# Patient Record
Sex: Female | Born: 1995 | Race: Black or African American | Hispanic: No | Marital: Single | Smoking: Current some day smoker
Health system: Southern US, Community
[De-identification: ages and names within clinical notes are randomized; demographics above are authoritative.]

## PROBLEM LIST (undated history)

## (undated) HISTORY — PX: WISDOM TOOTH EXTRACTION: SHX21

---
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
## 2017-12-22 LAB — HM HIV SCREENING LAB: HM HIV Screening: NEGATIVE

## 2020-01-27 ENCOUNTER — Encounter: Payer: Self-pay | Admitting: Physician Assistant

## 2020-01-27 ENCOUNTER — Ambulatory Visit: Payer: Self-pay | Admitting: Physician Assistant

## 2020-01-27 ENCOUNTER — Other Ambulatory Visit: Payer: Self-pay

## 2020-01-27 DIAGNOSIS — Z113 Encounter for screening for infections with a predominantly sexual mode of transmission: Secondary | ICD-10-CM

## 2020-01-27 LAB — WET PREP FOR TRICH, YEAST, CLUE
Trichomonas Exam: NEGATIVE
Yeast Exam: NEGATIVE

## 2020-01-27 NOTE — Progress Notes (Signed)
  [facility] Department STI clinic/screening visit  Subjective:  Patricia Medina is a 24 y.o. female being seen today for an STI screening visit. The patient reports they do not have symptoms.  Patient reports that they do not desire a pregnancy in the next year.   They reported they are not interested in discussing contraception today.  Patient's last menstrual period was 01/16/2020.   Patient has the following medical conditions:  There are no problems to display for this patient.   Chief Complaint  Patient presents with  . SEXUALLY TRANSMITTED DISEASE    screening    HPI  Patient reports that she is not having symptoms but would like a screening today.  Reports history of anemia, dental surgeries, last pap about 1.5 years ago, last HIV test 2020, and that she is using Nexplanon for [facility].   See flowsheet for further details and programmatic requirements.    The following portions of the patient's history were reviewed and updated as appropriate: allergies, current medications, past medical history, past social history, past surgical history and problem list.  Objective:  There were no vitals filed for this visit.  Physical Exam Constitutional:      General: She is not in acute distress.    Appearance: Normal appearance.  HENT:     Head: Normocephalic and atraumatic.     Comments: No nits, lice, or hair loss. No cervical, supraclavicular or axillary adenopathy.    Mouth/Throat:     Mouth: Mucous membranes are moist.     Pharynx: Oropharynx is clear. No oropharyngeal exudate or posterior oropharyngeal erythema.  Eyes:     Conjunctiva/sclera: Conjunctivae normal.  Pulmonary:     Effort: Pulmonary effort is normal.  Abdominal:     Palpations: Abdomen is soft. There is no mass.     Tenderness: There is no abdominal tenderness. There is no guarding or rebound.  Genitourinary:    General: Normal vulva.     Rectum: Normal.     Comments: External genitalia/pubic  area without nits, lice, edema, erythema, lesions and inguinal adenopathy. Vagina with normal mucosa and discharge. Cervix without visible lesions. Uterus firm, mobile, nt, no masses, no CMT, no adnexal tenderness or fullness. Musculoskeletal:     Cervical back: Neck supple. No tenderness.  Skin:    General: Skin is warm and dry.     Findings: No bruising, erythema, lesion or rash.  Neurological:     Mental Status: She is alert and oriented to person, place, and time.  Psychiatric:        Mood and Affect: Mood normal.        Behavior: Behavior normal.        Thought Content: Thought content normal.        Judgment: Judgment normal.      Assessment and Plan:  Patricia Medina is a 24 y.o. female presenting to the [facility] Department for STI screening  1. Screening for STD (sexually transmitted disease) Patient into clinic without symptoms. Reviewed wet mount results and no treatment indicated today. Rec condoms with all sex. Await test results.  Counseled that RN will call if needs to RTC for treatment once results are back. - WET PREP FOR TRICH, YEAST, CLUE - Gonococcus culture - Chlamydia/Gonorrhea [place] [facility] - HIV/HCV [place] [facility] - Syphilis Serology,  Lab     No follow-ups on file.  No future appointments.  Matt Holmes, PA

## 2020-01-27 NOTE — Progress Notes (Signed)
[employer] results reviewed by provider C. [place], [place]. Per above no treatment indicated. Tawny Hopping, RN

## 2020-02-02 LAB — GONOCOCCUS CULTURE

## 2020-02-06 LAB — HM HIV SCREENING LAB: HM HIV Screening: NEGATIVE

## 2020-02-06 LAB — HM HEPATITIS C SCREENING LAB: HM Hepatitis Screen: NEGATIVE

## 2020-02-07 ENCOUNTER — Encounter: Payer: Self-pay | Admitting: Family Medicine

## 2021-02-19 ENCOUNTER — Other Ambulatory Visit: Payer: Self-pay

## 2021-02-19 ENCOUNTER — Encounter: Payer: Self-pay | Admitting: Family Medicine

## 2021-02-19 ENCOUNTER — Ambulatory Visit: Payer: Self-pay

## 2021-02-19 ENCOUNTER — Ambulatory Visit (LOCAL_COMMUNITY_HEALTH_CENTER): Payer: Medicaid Other | Admitting: Family Medicine

## 2021-02-19 VITALS — BP 99/62 | HR 69 | Temp 97.7°F | Resp 18 | Ht 66.5 in | Wt 228.0 lb

## 2021-02-19 DIAGNOSIS — Z30017 Encounter for initial prescription of implantable subdermal contraceptive: Secondary | ICD-10-CM

## 2021-02-19 DIAGNOSIS — Z113 Encounter for screening for infections with a predominantly sexual mode of transmission: Secondary | ICD-10-CM

## 2021-02-19 DIAGNOSIS — Z3046 Encounter for surveillance of implantable subdermal contraceptive: Secondary | ICD-10-CM

## 2021-02-19 DIAGNOSIS — Z01419 Encounter for gynecological examination (general) (routine) without abnormal findings: Secondary | ICD-10-CM

## 2021-02-19 DIAGNOSIS — B9689 Other specified bacterial agents as the cause of diseases classified elsewhere: Secondary | ICD-10-CM

## 2021-02-19 DIAGNOSIS — Z1272 Encounter for screening for malignant neoplasm of vagina: Secondary | ICD-10-CM

## 2021-02-19 DIAGNOSIS — Z3009 Encounter for other general counseling and advice on contraception: Secondary | ICD-10-CM

## 2021-02-19 LAB — WET PREP FOR TRICH, YEAST, CLUE
Trichomonas Exam: NEGATIVE
Yeast Exam: NEGATIVE

## 2021-02-19 MED ORDER — METRONIDAZOLE 500 MG PO TABS: 500.0000 mg | ORAL_TABLET | Freq: Two times a day (BID) | ORAL | 0 refills | Status: AC

## 2021-02-19 MED ORDER — ETONOGESTREL 68 MG ~~LOC~~ IMPL
68.0000 mg | DRUG_IMPLANT | Freq: Once | SUBCUTANEOUS | Status: AC
  Administered 2021-02-19: 68 mg via SUBCUTANEOUS

## 2021-02-19 NOTE — Progress Notes (Signed)
Wet Prep reviewed with provider during clinic visit.   Treatment for BV given per SO.   Patient instructions given for Nexplanon insertion. Nexplanon card given.   Patient aware she will receive a call for any abnormal labs, patient has mychart set up to view other labs.    Floy Sabina, RN

## 2021-02-19 NOTE — Progress Notes (Signed)
Family Planning Visit- Repeat Yearly Visit  Subjective:  Patricia Medina is a 25 y.o. G1P0010  being seen today for an annual wellness visit and to discuss contraception options.   The patient is currently using Nexplanon for pregnancy prevention. Patient does not want a pregnancy in the next year. Patient has the following medical problems: does not have a problem list on file.  Chief Complaint  Patient presents with   Annual Exam   Contraception    Nexplanon     Patient reports here for physical, STI screening, pap and nexplanon removal reinsertion   Patient denies any problems or concerns.     See flowsheet for other program required questions.   Body mass index is 36.25 kg/m. - Patient is eligible for diabetes screening based on BMI and age >37?  not applicable HA1C ordered? not applicable  Patient reports 1 of partners in last year. Desires STI screening?  Yes   Has patient been screened once for HCV in the past?  No  No results found for: HCVAB  Does the patient have current of drug use, have a partner with drug use, and/or has been incarcerated since last result? No  If yes-- Screen for HCV through [facility]   Does the patient meet criteria for HBV testing? No  Criteria:  -Household, sexual or needle sharing contact with HBV -History of drug use -HIV positive -Those with known Hep C   Health Maintenance Due  Topic Date Due   COVID-19 Vaccine (1) Never done   Pneumococcal Vaccine 57-44 Years old (1 - PCV) Never done   HPV VACCINES (1 - 2-dose series) Never done   PAP-Cervical Cytology Screening  Never done   PAP SMEAR-Modifier  Never done   TETANUS/TDAP  03/16/2019    Review of Systems  Constitutional:  Negative for chills, fever, malaise/fatigue and weight loss.  HENT:  Negative for congestion, hearing loss and sore throat.   Eyes:  Negative for blurred vision, double vision and photophobia.  Respiratory:  Negative for shortness of breath.    Cardiovascular:  Negative for chest pain.  Gastrointestinal:  Negative for abdominal pain, blood in stool, constipation, diarrhea, heartburn, nausea and vomiting.  Genitourinary:  Negative for dysuria and frequency.  Musculoskeletal:  Negative for back pain, joint pain and neck pain.  Skin:  Negative for itching and rash.  Neurological:  Negative for dizziness, weakness and headaches.  Endo/Heme/Allergies:  Does not bruise/bleed easily.  Psychiatric/Behavioral:  Negative for depression, substance abuse and suicidal ideas.    The following portions of the patient's history were reviewed and updated as appropriate: allergies, current medications, past family history, past medical history, past social history, past surgical history and problem list. Problem list updated.  Objective:   Vitals:   02/19/21 0949  BP: 99/62  Pulse: 69  Resp: 18  Temp: 97.7 F (36.5 C)  Weight: 228 lb (103.4 kg)  Height: 5' 6.5" (1.689 m)    Physical Exam Vitals and nursing note reviewed.  Constitutional:      Appearance: Normal appearance.  HENT:     Head: Normocephalic and atraumatic.     Mouth/Throat:     Mouth: Mucous membranes are moist.     Dentition: Normal dentition. No dental caries.     Pharynx: No oropharyngeal exudate or posterior oropharyngeal erythema.  Eyes:     General: No scleral icterus. Neck:     Thyroid: No thyroid mass or thyromegaly.  Cardiovascular:     Rate  and Rhythm: Normal rate.     Pulses: Normal pulses.  Pulmonary:     Effort: Pulmonary effort is normal.  Chest:     Comments: Breasts:        Right: Normal. No swelling, mass, nipple discharge, skin change or tenderness.        Left: Normal. No swelling, mass, nipple discharge, skin change or tenderness.   Abdominal:     General: Abdomen is flat. Bowel sounds are normal.     Palpations: Abdomen is soft.  Genitourinary:    General: Normal vulva.     Rectum: Normal.     Comments: External genitalia without, lice,  nits, erythema, edema , lesions or inguinal adenopathy. Vagina with normal mucosa and off white color discharge, odor  and pH > 4.  Cervix without visual lesions, uterus firm, mobile, non-tender, no masses, CMT adnexal fullness or tenderness.   Musculoskeletal:        General: Normal range of motion.  Skin:    General: Skin is warm and dry.  Neurological:     General: No focal deficit present.     Mental Status: She is alert.      Assessment and Plan:  Patricia Medina is a 25 y.o. female G1P0010 presenting to the [facility] Department for an yearly wellness and contraception visit     1. Smear, vaginal, as part of routine gynecological examination Well woman exam,  CBE today  Pap today  - Pap IG (Image Guided)  2.  Family Planning Counseling  Contraception counseling: Reviewed all forms of birth control options in the tiered based approach. available including abstinence; over the counter/barrier methods; hormonal contraceptive medication including pill, patch, ring, injection,contraceptive implant, ECP; hormonal and nonhormonal IUDs; permanent sterilization options including vasectomy and the various tubal sterilization modalities. Risks, benefits, and typical effectiveness rates were reviewed.  Questions were answered.  Written information was also given to the patient to review.  Patient desires nexplanon, this was prescribed for patient. She will follow up as needed for surveillance.  She was told to call with any further questions, or with any concerns about this method of contraception.  Emphasized use of condoms 100% of the time for STI prevention.  Patient was not offered ECP based on current BCM.    3. Screening examination for venereal disease  - Chlamydia/Gonorrhea [place] [facility] - Gonococcus culture - HIV [place] [facility] - WET PREP FOR TRICH, YEAST, CLUE - Syphilis Serology, [place] [facility]  Patient accepted all screenings including wet prep, oral  gc plate  vaginal CT/GC and bloodwork for HIV/RPR.  Patient meets criteria for HepB screening? No. Ordered? No - does not meet criteria  Patient meets criteria for HepC screening? No. Ordered? No - does not meet criteria   Wet prep results + amine, + clue  Treatment needed for BV   Discussed time line for [facility] Lab results and that patient will be called with positive results and encouraged patient to call if she had not heard in 2 weeks.  Counseled to return or seek care for continued or worsening symptoms Recommended condom use with all sex  Patient is currently using *Nexplanon to prevent pregnancy.    4. Encounter for removal and reinsertion of Nexplanon Nexplanon Removal Patient identified, informed consent performed, consent signed.   Appropriate time out taken. Nexplanon site identified.  Area prepped in usual sterile fashon. 3 ml of 1% lidocaine with Epinephrine was used to anesthetize the area at the  distal end of the implant and along implant site. A small stab incision was made right beside the implant on the distal portion.  The Nexplanon rod was grasped using hemostats and removed without difficulty.  There was minimal blood loss. There were no complications.  Steri-strips were applied over the small incision.  A pressure bandage was applied to reduce any bruising.  The patient tolerated the procedure well and was given post procedure instructions.    Nexplanon Insertion Procedure Patient identified, informed consent performed, consent signed.   Patient does understand that irregular bleeding is a very common side effect of this medication. She was advised to have backup contraception after placement. Patient was determined to meet WHO criteria for not being pregnant. Appropriate time out taken.  The insertion site was identified 8-10 cm (3-4 inches) from the medial epicondyle of the humerus and 3-5 cm (1.25-2 inches) posterior to (below) the sulcus (groove) between the biceps and triceps  muscles of the patient's left  arm and marked.  Patient was prepped with alcohol swab and then injected with 3 ml of 1% lidocaine.  Arm was prepped with chlorhexidene, Nexplanon removed from packaging,  Device confirmed in needle, then inserted full length of needle and withdrawn per handbook instructions. Nexplanon was able to palpated in the patient's arm; patient palpated the insert herself. There was minimal blood loss.  Patient insertion site covered with guaze and a pressure bandage to reduce any bruising.  The patient tolerated the procedure well and was given post procedure instructions.    Counseled patient to take OTC analgesic starting as soon as lidocaine starts to wear off and take regularly for at least 48 hr to decrease discomfort.  Specifically to take with food or milk to decrease stomach upset and for IB 600 mg (3 tablets) every 6 hrs; IB 800 mg (4 tablets) every 8 hrs; or Aleve 2 tablets every 12 hrs.   - etonogestrel (NEXPLANON) implant 68 mg  5. BV (bacterial vaginosis)  - metroNIDAZOLE (FLAGYL) 500 MG tablet; Take 1 tablet (500 mg total) by mouth 2 (two) times daily for 7 days.  Dispense: 14 tablet; Refill: 0    No follow-ups on file.  No future appointments.  Wendi Snipes, FNP

## 2021-02-22 LAB — PAP IG (IMAGE GUIDED): PAP Smear Comment: 0

## 2021-02-23 LAB — GONOCOCCUS CULTURE

## 2023-08-31 ENCOUNTER — Encounter: Payer: Self-pay | Admitting: Physician Assistant

## 2023-08-31 ENCOUNTER — Ambulatory Visit (INDEPENDENT_AMBULATORY_CARE_PROVIDER_SITE_OTHER): Payer: 59 | Admitting: Physician Assistant

## 2023-08-31 VITALS — BP 131/82 | HR 98 | Ht 66.5 in | Wt 241.0 lb

## 2023-08-31 DIAGNOSIS — Z7689 Persons encountering health services in other specified circumstances: Secondary | ICD-10-CM

## 2023-08-31 DIAGNOSIS — G5603 Carpal tunnel syndrome, bilateral upper limbs: Secondary | ICD-10-CM | POA: Diagnosis not present

## 2023-08-31 DIAGNOSIS — Z202 Contact with and (suspected) exposure to infections with a predominantly sexual mode of transmission: Secondary | ICD-10-CM | POA: Diagnosis not present

## 2023-08-31 DIAGNOSIS — N898 Other specified noninflammatory disorders of vagina: Secondary | ICD-10-CM | POA: Diagnosis not present

## 2023-08-31 DIAGNOSIS — E669 Obesity, unspecified: Secondary | ICD-10-CM | POA: Diagnosis not present

## 2023-08-31 NOTE — Progress Notes (Signed)
New patient visit  Patient: Patricia Medina   DOB: 02-01-1996   28 y.o. Female  MRN: [id number] Visit Date: 08/31/2023  Today's healthcare provider: Debera Lat, PA-C   Chief Complaint  Patient presents with   Establish Care   STD Screening   Subjective    Patricia Medina is a 28 y.o. female who presents today as a new patient to establish care.   Discussed the use of AI scribe software for clinical note transcription with the patient, who gave verbal consent to proceed.  History of Present Illness   The patient, with a history of depression and anxiety, presents with bilateral hand discomfort, more pronounced on the right side, suspecting carpal tunnel syndrome. The patient reports that the discomfort is more noticeable after a few days of work, but improves with rest over the weekend. 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  In addition to the hand discomfort, the patient requests a comprehensive STD test, despite having the same partner, as it has been a couple of years since the last test. The patient has a history of bacterial vaginosis but denies any other STDs. The patient is currently menstruating and does not report any concerning vaginal discharge.  The patient also reports slow bowel movements since childhood, which could be indicative of chronic constipation. The patient is a smoker, consuming one to two cigarettes two to three nights a week. The patient also consumes alcohol and marijuana on a similar frequency.        History reviewed. No pertinent past medical history. History reviewed. No pertinent surgical history. Family Status  Relation Name Status   Mother  Alive   Father  Alive   MGM  Alive   MGF  Deceased   PGM  Alive   PGF  Deceased   Cousin  Alive  No partnership data on file   Family History  Problem Relation Age of Onset   Arthritis Mother    Depression Mother     Lupus Mother    Healthy Father    Hypertension Maternal Grandmother    Diabetes Maternal Grandmother    Diabetes Maternal Grandfather    Healthy Paternal Grandmother    Cancer Paternal Grandfather    Lymphoma Cousin    Social History   Socioeconomic History   Marital status: Single    Spouse name: Not on file   Number of children: 0   Years of education: 14   Highest education level: Some college, no degree  Occupational History    Comment: Architect [employer]  Tobacco Use   Smoking status: Some Days    Types: E-cigarettes    Passive exposure: Past   Smokeless tobacco: Never  Vaping Use   Vaping status: Not on file  Substance and Sexual Activity   Alcohol use: Yes    Alcohol/week: 3.0 standard drinks of alcohol    Types: 3 Cans of beer per week    Comment: Socially/last ETOH on 02/16/21   Drug use: Not Currently   Sexual activity: Yes    Partners: Male    Birth control/protection: Implant  Other Topics Concern   Not on file  Social History Narrative   Not on file   Social Drivers of Health   Financial Resource Strain: Not on file  Food Insecurity: Not on file  Transportation Needs: Not on file  Physical Activity: Not on file  Stress: Not on file  Social Connections: Not on file   Outpatient Medications Prior to Visit  Medication Sig   [DISCONTINUED] etonogestrel (NEXPLANON) 68 MG IMPL implant 1 each by Subdermal route once. (Patient not taking: Reported on 02/19/2021)   [DISCONTINUED] Multiple Vitamin (MULTIVITAMIN) tablet Take 1 tablet by mouth daily.   No facility-administered medications prior to visit.   Allergies  Allergen Reactions   Citrus Itching    Itching tongue and sides of mouth    Immunization History  Administered Date(s) Administered   Dtap, Unspecified 10/17/1997, 12/05/1997, 01/26/1998, 05/27/1999, 06/09/2002   HIB, Unspecified 10/24/1997, 12/05/1997, 01/26/1998, 05/27/1999   Hep A, Unspecified 04/18/2009, 05/07/2010   Hep B,  Unspecified 20-Apr-1998, 10/17/1997, 03/30/1998   IPV 10/17/1997, 12/05/1997, 01/26/1998, 06/09/2002   Influenza-Unspecified 04/18/2009, 05/07/2010, 06/29/2013   MMR 08/08/1998, 06/09/2002   Meningococcal Conjugate 03/21/2014   Meningococcal Mcv4,unspecified 04/18/2009   Tdap 03/15/2009   Varicella 08/08/1998, 05/07/2010    Health Maintenance  Topic Date Due   Pneumococcal Vaccine 55-29 Years old (1 of 2 - PCV) Never done   HPV VACCINES (1 - 3-dose series) Never done   DTaP/Tdap/Td (7 - Td or Tdap) 03/16/2019   INFLUENZA VACCINE  02/26/2023   COVID-19 Vaccine (1 - 2024-25 season) Never done   Cervical Cancer Screening (Pap smear)  02/20/2024   Hepatitis C Screening  Completed   HIV Screening  Completed    Patient Care Team: Debera Lat, PA-C as PCP - General (Physician Assistant)  Review of Systems  All other systems reviewed and are negative.  Except see HPI       Objective    BP 131/82   Pulse 98   Ht 5' 6.5" (1.689 m)   Wt 241 lb (109.3 kg)   SpO2 99%   BMI 38.32 kg/m     Physical Exam Vitals reviewed.  Constitutional:      General: She is not in acute distress.    Appearance: Normal appearance. She is well-developed. She is obese. She is not diaphoretic.  HENT:     Head: Normocephalic and atraumatic.  Eyes:     General: No scleral icterus.    Conjunctiva/sclera: Conjunctivae normal.  Neck:     Thyroid: No thyromegaly.  Cardiovascular:     Rate and Rhythm: Normal rate and regular rhythm.     Pulses: Normal pulses.     Heart sounds: Normal heart sounds. No murmur heard. Pulmonary:     Effort: Pulmonary effort is normal. No respiratory distress.     Breath sounds: Normal breath sounds. No wheezing, rhonchi or rales.  Musculoskeletal:     Cervical back: Neck supple.     Right lower leg: No edema.     Left lower leg: No edema.  Lymphadenopathy:     Cervical: No cervical adenopathy.  Skin:    General: Skin is warm and dry.     Findings: No rash.   Neurological:     Mental Status: She is alert and oriented to person, place, and time. Mental status is at baseline.  Psychiatric:        Mood and Affect: Mood normal.        Behavior: Behavior normal.     Depression Screen    08/31/2023    2:49 PM 02/19/2021   10:26 AM  PHQ 2/9 Scores  PHQ - 2 Score 2 0  PHQ- 9 Score 8    No results found for any visits on 08/31/23.  Assessment & Plan  Carpal Tunnel Syndrome Bilateral symptoms, more pronounced on the right side. No current numbness or pain. Occupation involves repetitive hand movements. -Advise contrast baths (hot and cold immersion) for 20 minutes daily. -Recommend exercises for carpal tunnel syndrome. -Consider use of soft braces during work and more solid braces with metallic support during sleep.  Sexually Transmitted Disease (STD) Screening No new partners or symptoms. Last screening was a few years ago. -Order comprehensive STD screening including gonorrhea, chlamydia, trichomoniasis, bacterial vaginosis, candida yeast infection, syphilis, and HIV. -Plan to conduct STD screening during the physical exam in four weeks.  General Health Maintenance -Schedule physical exam in four weeks. -Advise high fiber diet and increased water intake for chronic constipation. -Plan Pap smear in July, three years after the last one. -Check vaccination records for HPV and consider tetanus shot.     Obesity (HCC) Chronic Body mass index is 38.32 kg/m. Weight loss of 5% of pt's current weight via healthy diet and daily exercise encouraged. Ordered labs, see below  Obesity (BMI 30-39.9) (Primary) - CBC with Differential/Platelet - Comprehensive metabolic panel - Hemoglobin A1c - Lipid panel - TSH  Encounter for assessment of STD exposure Pt was advised that It is generally recommended to avoid testing during menses unless clinically necessary, to ensure the accuracy and reliability of test results.  - Cervicovaginal  ancillary only - RPR - HIV antibody (with reflex)  Vaginal discharge - Cervicovaginal ancillary only  Encounter to establish care Welcomed to our clinic Reviewed past medical hx, social hx, family hx and surgical hx Pt advised to send all vaccination records or screening   Return in about 4 weeks (around 09/28/2023) for CPE and pap in july.    The patient was advised to call back or seek an in-person evaluation if the symptoms worsen or if the condition fails to improve as anticipated.  I discussed the assessment and treatment plan with the patient. The patient was provided an opportunity to ask questions and all were answered. The patient agreed with the plan and demonstrated an understanding of the instructions.  I, Debera Lat, PA-C have reviewed all documentation for this visit. The documentation on  08/31/2023   for the exam, diagnosis, procedures, and orders are all accurate and complete.  Debera Lat, [facility], MMS [facility] [phone] (phone) [phone] (fax)  [facility]

## 2023-09-16 ENCOUNTER — Encounter: Payer: Self-pay | Admitting: Physician Assistant

## 2023-09-16 LAB — RPR: RPR Ser Ql: NONREACTIVE

## 2023-09-16 LAB — HIV ANTIBODY (ROUTINE TESTING W REFLEX): HIV Screen 4th Generation wRfx: NONREACTIVE

## 2023-09-16 LAB — LIPID PANEL
Chol/HDL Ratio: 2.3 {ratio} (ref 0.0–4.4)
Cholesterol, Total: 142 mg/dL (ref 100–199)
HDL: 61 mg/dL (ref >39)
LDL Chol Calc (NIH): 71 mg/dL (ref 0–99)
Triglycerides: 44 mg/dL (ref 0–149)
VLDL Cholesterol Cal: 10 mg/dL (ref 5–40)

## 2023-09-16 LAB — TSH: TSH: 0.521 u[IU]/mL (ref 0.450–4.500)

## 2023-09-16 LAB — CBC WITH DIFFERENTIAL/PLATELET
Basophils Absolute: 0 10*3/uL (ref 0.0–0.2)
Basos: 1 %
EOS (ABSOLUTE): 0.1 10*3/uL (ref 0.0–0.4)
Eos: 1 %
Hematocrit: 35.3 % (ref 34.0–46.6)
Hemoglobin: 11.4 g/dL (ref 11.1–15.9)
Immature Grans (Abs): 0 10*3/uL (ref 0.0–0.1)
Immature Granulocytes: 0 %
Lymphocytes Absolute: 3 10*3/uL (ref 0.7–3.1)
Lymphs: 50 %
MCH: 27.7 pg (ref 26.6–33.0)
MCHC: 32.3 g/dL (ref 31.5–35.7)
MCV: 86 fL (ref 79–97)
Monocytes Absolute: 0.5 10*3/uL (ref 0.1–0.9)
Monocytes: 8 %
Neutrophils Absolute: 2.5 10*3/uL (ref 1.4–7.0)
Neutrophils: 40 %
Platelets: 312 10*3/uL (ref 150–450)
RBC: 4.12 x10E6/uL (ref 3.77–5.28)
RDW: 11.9 % (ref 11.7–15.4)
WBC: 6.1 10*3/uL (ref 3.4–10.8)

## 2023-09-16 LAB — COMPREHENSIVE METABOLIC PANEL
ALT: 15 [IU]/L (ref 0–32)
AST: 16 [IU]/L (ref 0–40)
Albumin: 4.2 g/dL (ref 4.0–5.0)
Alkaline Phosphatase: 66 [IU]/L (ref 44–121)
BUN/Creatinine Ratio: 9 (ref 9–23)
BUN: 8 mg/dL (ref 6–20)
Bilirubin Total: 0.5 mg/dL (ref 0.0–1.2)
CO2: 22 mmol/L (ref 20–29)
Calcium: 9.5 mg/dL (ref 8.7–10.2)
Chloride: 103 mmol/L (ref 96–106)
Creatinine, Ser: 0.88 mg/dL (ref 0.57–1.00)
Globulin, Total: 2.4 g/dL (ref 1.5–4.5)
Glucose: 89 mg/dL (ref 70–99)
Potassium: 4.5 mmol/L (ref 3.5–5.2)
Sodium: 138 mmol/L (ref 134–144)
Total Protein: 6.6 g/dL (ref 6.0–8.5)
eGFR: 93 mL/min/{1.73_m2} (ref >59)

## 2023-09-16 LAB — HEMOGLOBIN A1C
Est. average glucose Bld gHb Est-mCnc: 108 mg/dL
Hgb A1c MFr Bld: 5.4 % (ref 4.8–5.6)

## 2023-09-29 ENCOUNTER — Encounter: Payer: 59 | Admitting: Physician Assistant

## 2023-10-25 NOTE — Progress Notes (Unsigned)
 Complete physical exam  Patient: Patricia Medina   DOB: 01-May-1996   28 y.o. Female  MRN: [id number] Visit Date: 10/26/2023  Today's healthcare provider: Debera Lat, PA-C   No chief complaint on file.  Subjective    Patricia Medina is a 28 y.o. female who presents today for a complete physical exam.  She reports consuming a {diet types:17450} diet. {Exercise:19826} She generally feels {well/fairly well/poorly:18703}. She reports sleeping {well/fairly well/poorly:18703}. She {does/does not:200015} have additional problems to discuss today.  HPI  *** Discussed the use of AI scribe software for clinical note transcription with the patient, who gave verbal consent to proceed.  History of Present Illness     Last depression screening scores    08/31/2023    2:49 PM 02/19/2021   10:26 AM  PHQ 2/9 Scores  PHQ - 2 Score 2 0  PHQ- 9 Score 8    Last fall risk screening    08/31/2023    2:49 PM  Fall Risk   Falls in the past year? 0  Injury with Fall? 0   Last Audit-C alcohol use screening     No data to display         A score of 3 or more in women, and 4 or more in men indicates increased risk for alcohol abuse, EXCEPT if all of the points are from question 1   No past medical history on file. No past surgical history on file. Social History   Socioeconomic History  . Marital status: Single    Spouse name: Not on file  . Number of children: 0  . Years of education: 74  . Highest education level: Some college, no degree  Occupational History    Comment: [employer]  Tobacco Use  . Smoking status: Some Days    Types: E-cigarettes    Passive exposure: Past  . Smokeless tobacco: Never  Vaping Use  . Vaping status: Not on file  Substance and Sexual Activity  . Alcohol use: Yes    Alcohol/week: 3.0 standard drinks of alcohol    Types: 3 Cans of beer per week    Comment: Socially/last ETOH on 02/16/21  . Drug use: Not Currently  . Sexual  activity: Yes    Partners: Male    Birth control/protection: Implant  Other Topics Concern  . Not on file  Social History Narrative  . Not on file   Social Drivers of Health   Financial Resource Strain: Not on file  Food Insecurity: Not on file  Transportation Needs: Not on file  Physical Activity: Not on file  Stress: Not on file  Social Connections: Not on file  Intimate Partner Violence: Not At Risk (02/19/2021)   Humiliation, Afraid, Rape, and Kick questionnaire   . Fear of Current or Ex-Partner: No   . Emotionally Abused: No   . Physically Abused: No   . Sexually Abused: No   Family Status  Relation Name Status  . Mother  Alive  . Father  Alive  . MGM  Alive  . MGF  Deceased  . PGM  Alive  . PGF  Deceased  . Cousin  Alive  No partnership data on file   Family History  Problem Relation Age of Onset  . Arthritis Mother   . Depression Mother   . Lupus Mother   . Healthy Father   . Hypertension Maternal Grandmother   . Diabetes Maternal Grandmother   . Diabetes Maternal Grandfather   .  Healthy Paternal Grandmother   . Cancer Paternal Grandfather   . Lymphoma Cousin    Allergies  Allergen Reactions  . Citrus Itching    Itching tongue and sides of mouth    Patient Care Team: Cherlynn Polo as PCP - General (Physician Assistant)   Medications: No outpatient medications prior to visit.   No facility-administered medications prior to visit.    Review of Systems  All other systems reviewed and are negative. Except see HPI  {Insert previous labs (optional):23779} {See past labs  Heme  Chem  Endocrine  Serology  Results Review (optional):1}  Objective    There were no vitals taken for this visit. {Insert last BP/Wt (optional):23777}{See vitals history (optional):1}    Physical Exam Vitals reviewed.  Constitutional:      General: She is not in acute distress.    Appearance: Normal appearance. She is well-developed. She is not  ill-appearing, toxic-appearing or diaphoretic.  HENT:     Head: Normocephalic and atraumatic.     Right Ear: Tympanic membrane, ear canal and external ear normal.     Left Ear: Tympanic membrane, ear canal and external ear normal.     Nose: Nose normal. No congestion or rhinorrhea.     Mouth/Throat:     Mouth: Mucous membranes are moist.     Pharynx: Oropharynx is clear. No oropharyngeal exudate.  Eyes:     General: No scleral icterus.       Right eye: No discharge.        Left eye: No discharge.     Conjunctiva/sclera: Conjunctivae normal.     Pupils: Pupils are equal, round, and reactive to light.  Neck:     Thyroid: No thyromegaly.     Vascular: No carotid bruit.  Cardiovascular:     Rate and Rhythm: Normal rate and regular rhythm.     Pulses: Normal pulses.     Heart sounds: Normal heart sounds. No murmur heard.    No friction rub. No gallop.  Pulmonary:     Effort: Pulmonary effort is normal. No respiratory distress.     Breath sounds: Normal breath sounds. No wheezing or rales.  Abdominal:     General: Abdomen is flat. Bowel sounds are normal. There is no distension.     Palpations: Abdomen is soft. There is no mass.     Tenderness: There is no abdominal tenderness. There is no right CVA tenderness, left CVA tenderness, guarding or rebound.     Hernia: No hernia is present.  Musculoskeletal:        General: No swelling, tenderness, deformity or signs of injury. Normal range of motion.     Cervical back: Normal range of motion and neck supple. No rigidity or tenderness.     Right lower leg: No edema.     Left lower leg: No edema.  Lymphadenopathy:     Cervical: No cervical adenopathy.  Skin:    General: Skin is warm and dry.     Coloration: Skin is not jaundiced or pale.     Findings: No bruising, erythema, lesion or rash.  Neurological:     Mental Status: She is alert and oriented to person, place, and time. Mental status is at baseline.     Gait: Gait normal.   Psychiatric:        Mood and Affect: Mood normal.        Behavior: Behavior normal.        Thought Content: Thought content normal.  Judgment: Judgment normal.     No results found for any visits on 10/26/23.  Assessment & Plan    Routine Health Maintenance and Physical Exam  Exercise Activities and Dietary recommendations  Goals   None     Immunization History  Administered Date(s) Administered  . Dtap, Unspecified 10/17/1997, 12/05/1997, 01/26/1998, 05/27/1999, 06/09/2002  . HIB, Unspecified 10/24/1997, 12/05/1997, 01/26/1998, 05/27/1999  . Hep A, Unspecified 04/18/2009, 05/07/2010  . Hep B, Unspecified 01-11-1998, 10/17/1997, 03/30/1998  . IPV 10/17/1997, 12/05/1997, 01/26/1998, 06/09/2002  . Influenza-Unspecified 04/18/2009, 05/07/2010, 06/29/2013  . MMR 08/08/1998, 06/09/2002  . Meningococcal Conjugate 03/21/2014  . Meningococcal Mcv4,unspecified 04/18/2009  . Tdap 03/15/2009  . Varicella 08/08/1998, 05/07/2010    Health Maintenance  Topic Date Due  . Pneumococcal Vaccination (1 of 2 - PCV) Never done  . HPV Vaccine (1 - 3-dose series) Never done  . DTaP/Tdap/Td vaccine (7 - Td or Tdap) 03/16/2019  . Flu Shot  02/26/2023  . COVID-19 Vaccine (1 - 2024-25 season) Never done  . Pap Smear  02/20/2024  . Hepatitis C Screening  Completed  . HIV Screening  Completed    Discussed health benefits of physical activity, and encouraged her to engage in regular exercise appropriate for her age and condition.  Assessment and Plan Assessment & Plan      ***  No follow-ups on file.    The patient was advised to call back or seek an in-person evaluation if the symptoms worsen or if the condition fails to improve as anticipated.  I discussed the assessment and treatment plan with the patient. The patient was provided an opportunity to ask questions and all were answered. The patient agreed with the plan and demonstrated an understanding of the  instructions.  I, Debera Lat, PA-C have reviewed all documentation for this visit. The documentation on 10/26/2023  for the exam, diagnosis, procedures, and orders are all accurate and complete.  Debera Lat, [facility], MMS [facility] [phone] (phone) [phone] (fax)  [facility]

## 2023-10-26 ENCOUNTER — Other Ambulatory Visit (HOSPITAL_COMMUNITY)
Admission: RE | Admit: 2023-10-26 | Discharge: 2023-10-26 | Disposition: A | Discharge status: Home / Self Care | Source: Ambulatory Visit | Attending: Physician Assistant | Admitting: Physician Assistant

## 2023-10-26 ENCOUNTER — Encounter: Payer: Self-pay | Admitting: Physician Assistant

## 2023-10-26 ENCOUNTER — Ambulatory Visit: Payer: 59 | Admitting: Physician Assistant

## 2023-10-26 VITALS — BP 106/71 | HR 81 | Resp 15 | Wt 236.6 lb

## 2023-10-26 DIAGNOSIS — Z23 Encounter for immunization: Secondary | ICD-10-CM | POA: Diagnosis not present

## 2023-10-26 DIAGNOSIS — Z113 Encounter for screening for infections with a predominantly sexual mode of transmission: Secondary | ICD-10-CM

## 2023-10-26 DIAGNOSIS — Z0000 Encounter for general adult medical examination without abnormal findings: Secondary | ICD-10-CM | POA: Insufficient documentation

## 2023-10-26 DIAGNOSIS — J302 Other seasonal allergic rhinitis: Secondary | ICD-10-CM | POA: Diagnosis not present

## 2023-10-26 DIAGNOSIS — Z0001 Encounter for general adult medical examination with abnormal findings: Secondary | ICD-10-CM

## 2023-10-27 ENCOUNTER — Encounter: Payer: Self-pay | Admitting: Physician Assistant

## 2023-10-27 LAB — CERVICOVAGINAL ANCILLARY ONLY
Bacterial Vaginitis (gardnerella): NEGATIVE
Candida Glabrata: NEGATIVE
Candida Vaginitis: NEGATIVE
Chlamydia: NEGATIVE
Comment: NEGATIVE
Comment: NEGATIVE
Comment: NEGATIVE
Comment: NEGATIVE
Comment: NEGATIVE
Comment: NORMAL
Neisseria Gonorrhea: NEGATIVE
Trichomonas: NEGATIVE

## 2024-01-28 ENCOUNTER — Ambulatory Visit: Payer: 59 | Admitting: Physician Assistant

## 2024-01-28 ENCOUNTER — Telehealth: Payer: Self-pay

## 2024-01-28 NOTE — Telephone Encounter (Signed)
 Copied from CRM [id number]. Topic: General - Other >> Jan 28, 2024 10:38 AM Wess RAMAN wrote: Reason for CRM: Patient has appt today to do her PAP smear but is on her cycle. She wanted to know if she should reschedule or was it okay to attend the appt.   Callback #: [phone]

## 2024-02-12 ENCOUNTER — Ambulatory Visit (INDEPENDENT_AMBULATORY_CARE_PROVIDER_SITE_OTHER): Payer: Self-pay | Admitting: Physician Assistant

## 2024-02-12 VITALS — BP 84/74 | HR 85 | Resp 16 | Ht 66.0 in | Wt 210.0 lb

## 2024-02-12 DIAGNOSIS — D6489 Other specified anemias: Secondary | ICD-10-CM

## 2024-02-12 DIAGNOSIS — N921 Excessive and frequent menstruation with irregular cycle: Secondary | ICD-10-CM

## 2024-02-12 NOTE — Progress Notes (Signed)
 Established patient visit  Patient: Patricia Medina   DOB: July 28, 1996   28 y.o. Female  MRN: [id number] Visit Date: 02/12/2024  Today's healthcare provider: Jolynn Spencer, PA-C   Chief Complaint  Patient presents with   Gynecologic Exam    P..No other concerns   Subjective     HPI     Gynecologic Exam    Additional comments: P..No other concerns      Last edited by Marylen Odella CROME, CMA on 02/12/2024  1:19 PM.       Discussed the use of AI scribe software for clinical note transcription with the patient, who gave verbal consent to proceed.  History of Present Illness Patricia Medina is a 28 year old female who presents for blood work to check hemoglobin levels.  She is concerned about decreased blood pressure despite adequate hydration and nutrition. She denies dizziness, headaches, or palpitations.       10/26/2023   10:32 AM 08/31/2023    2:49 PM 02/19/2021   10:26 AM  Depression screen PHQ 2/9  Decreased Interest 0 1 0  Down, Depressed, Hopeless 1 1 0  PHQ - 2 Score 1 2 0  Altered sleeping 2 3   Tired, decreased energy 1 1   Change in appetite 0 1   Feeling bad or failure about yourself  0 0   Trouble concentrating 0 0   Moving slowly or fidgety/restless 0 1   Suicidal thoughts 0 0   PHQ-9 Score 4 8   Difficult doing work/chores Not difficult at all Somewhat difficult       10/26/2023   10:33 AM 08/31/2023    2:49 PM  GAD 7 : Generalized Anxiety Score  Nervous, Anxious, on Edge 0 1  Control/stop worrying 0 0  Worry too much - different things 0 1  Trouble relaxing 1 0  Restless 1 1  Easily annoyed or irritable 0 0  Afraid - awful might happen 0 0  Total GAD 7 Score 2 3  Anxiety Difficulty Not difficult at all Not difficult at all    Medications: No outpatient medications prior to visit.   No facility-administered medications prior to visit.    Review of Systems All negative Except see HPI       Objective    BP (!) 84/74 (BP Location:  Left Arm, Patient Position: Sitting, Cuff Size: Normal)   Pulse 85   Resp 16   Ht 5' 6 (1.676 m)   Wt 210 lb (95.3 kg)   SpO2 98%   BMI 33.89 kg/m     Physical Exam Vitals reviewed.  Constitutional:      General: She is not in acute distress.    Appearance: Normal appearance. She is well-developed. She is not diaphoretic.  HENT:     Head: Normocephalic and atraumatic.  Eyes:     General: No scleral icterus.    Conjunctiva/sclera: Conjunctivae normal.  Neck:     Thyroid: No thyromegaly.  Cardiovascular:     Rate and Rhythm: Normal rate and regular rhythm.     Pulses: Normal pulses.     Heart sounds: Normal heart sounds. No murmur heard. Pulmonary:     Effort: Pulmonary effort is normal. No respiratory distress.     Breath sounds: Normal breath sounds. No wheezing, rhonchi or rales.  Musculoskeletal:     Cervical back: Neck supple.     Right lower leg: No edema.     Left lower leg: No edema.  Lymphadenopathy:     Cervical: No cervical adenopathy.  Skin:    General: Skin is warm and dry.     Findings: No rash.  Neurological:     Mental Status: She is alert and oriented to person, place, and time. Mental status is at baseline.  Psychiatric:        Mood and Affect: Mood normal.        Behavior: Behavior normal.      No results found for any visits on 02/12/24.      Assessment & Plan Anemia Chronic In the past, iron deficiency. Anemia may contribute to symptoms, with concern about hemoglobin levels. Advised monitoring symptoms in relation to menstrual cycle. - Order blood work to check hemoglobin levels. - Advise monitoring of symptoms during menstrual periods. - Instruct to seek medical attention if menstrual problems occur. Continue to take iron supplements Will follow-up  Breakthrough bleeding Pt has been on Nexplanon  for contraception However, he has been having irregular bleedings lasted from 2.5 week to 1 month Advised to follow-up with obgyn at  health department Referral will be placed if she prefers Refer to obgyn for Pap smear  Will follow-up   General Health Maintenance Annual physical examination completed before. Emphasized regular check-ups to monitor health. - Ensure annual physical examination is scheduled for next year. - Advise follow-up sooner if symptoms persist.   Orders Placed This Encounter  Procedures   CBC w/Diff/Platelet    No follow-ups on file.   The patient was advised to call back or seek an in-person evaluation if the symptoms worsen or if the condition fails to improve as anticipated.  I discussed the assessment and treatment plan with the patient. The patient was provided an opportunity to ask questions and all were answered. The patient agreed with the plan and demonstrated an understanding of the instructions.  I, Donya Hitch, PA-C have reviewed all documentation for this visit. The documentation on 02/12/2024  for the exam, diagnosis, procedures, and orders are all accurate and complete.  Jolynn Spencer, [facility], MMS [facility] [phone] (phone) [phone] (fax)  [facility]

## 2024-02-13 ENCOUNTER — Encounter: Payer: Self-pay | Admitting: Physician Assistant

## 2024-02-14 ENCOUNTER — Ambulatory Visit: Payer: Self-pay | Admitting: Physician Assistant

## 2024-02-16 LAB — CBC WITH DIFFERENTIAL/PLATELET
Basophils Absolute: 0.1 x10E3/uL (ref 0.0–0.2)
Basos: 1 %
EOS (ABSOLUTE): 0.1 x10E3/uL (ref 0.0–0.4)
Eos: 1 %
Hematocrit: 35.8 % (ref 34.0–46.6)
Hemoglobin: 11.4 g/dL (ref 11.1–15.9)
Immature Grans (Abs): 0 x10E3/uL (ref 0.0–0.1)
Immature Granulocytes: 0 %
Lymphocytes Absolute: 2.4 x10E3/uL (ref 0.7–3.1)
Lymphs: 37 %
MCH: 27.3 pg (ref 26.6–33.0)
MCHC: 31.8 g/dL (ref 31.5–35.7)
MCV: 86 fL (ref 79–97)
Monocytes Absolute: 0.6 x10E3/uL (ref 0.1–0.9)
Monocytes: 9 %
Neutrophils Absolute: 3.4 x10E3/uL (ref 1.4–7.0)
Neutrophils: 52 %
Platelets: 301 x10E3/uL (ref 150–450)
RBC: 4.17 x10E6/uL (ref 3.77–5.28)
RDW: 12.3 % (ref 11.7–15.4)
WBC: 6.6 x10E3/uL (ref 3.4–10.8)

## 2024-02-18 ENCOUNTER — Ambulatory Visit

## 2024-03-08 ENCOUNTER — Ambulatory Visit

## 2024-03-08 VITALS — BP 113/71 | HR 75 | Ht 66.0 in | Wt 209.8 lb

## 2024-03-08 DIAGNOSIS — Z3009 Encounter for other general counseling and advice on contraception: Secondary | ICD-10-CM

## 2024-03-08 DIAGNOSIS — Z113 Encounter for screening for infections with a predominantly sexual mode of transmission: Secondary | ICD-10-CM

## 2024-03-08 DIAGNOSIS — Z124 Encounter for screening for malignant neoplasm of cervix: Secondary | ICD-10-CM

## 2024-03-08 LAB — WET PREP FOR TRICH, YEAST, CLUE
Clue Cell Exam: POSITIVE — AB
Trichomonas Exam: NEGATIVE
Yeast Exam: NEGATIVE

## 2024-03-08 NOTE — Progress Notes (Signed)
 [facility] [address] [place] [address] Main phone: [phone]  Family Planning Visit - Repeat Yearly Visit  Subjective:  Patricia Medina is a 28 y.o. G1P0010  being seen today for an annual wellness visit and to discuss contraception options. The patient is currently using hormonal implant for pregnancy prevention. Patient does not want a pregnancy in the next year. Nexplanon  in place and due to change in 2026.  Patient has the following medical problems:  Patient Active Problem List   Diagnosis Date Noted   Seasonal allergies 10/26/2023   Annual physical exam 10/26/2023   Chief Complaint  Patient presents with   Acute Visit    Pap smear only-    HPI Patient reports desire for STI testing, need for pap test. Nexplanon  in place and expires July 2026. Declines HIV/RPR testing today. ROS negative.  Review of Systems  All other systems reviewed and are negative.  See flowsheet for further details and programmatic requirements Hyperlink available at the top of the signed note in blue.  Flow sheet content below:  Pregnancy Intention Screening Does the patient want to become pregnant in the next year?: No Does the patient's partner want to become pregnant in the next year?: No Would the patient like to discuss contraceptive options today?: No Other:  Password: Declines Contraception Wrap Up Current Method: Hormonal Implant  Diabetes screening This patient is 28 y.o. with a BMI of Body mass index is 33.86 kg/m.SABRA  Is patient eligible for diabetes screening (age >35 and BMI >25)?  no  Was Hgb A1c ordered? no  STI screening Patient reports 2 of partners in last year.  Does this patient desire STI screening?  Yes  Cervical Cancer Screening  Result Date Procedure Results Follow-ups  02/19/2021 Pap IG (Image Guided) DIAGNOSIS:: Comment Specimen adequacy:: Comment Clinician Provided ICD10:  Comment Performed by:: Comment PAP Smear Comment: . Note:: Comment Test Methodology: Comment     Health Maintenance Due  Topic Date Due   HPV VACCINES (1 - 3-dose series) Never done   Pneumococcal Vaccine: 19-49 Years (1 of 2 - PCV) Never done   Hepatitis B Vaccines (1 of 3 - 19+ 3-dose series) Never done   COVID-19 Vaccine (1 - 2024-25 season) Never done   Cervical Cancer Screening (Pap smear)  02/20/2024   INFLUENZA VACCINE  02/26/2024    The following portions of the patient's history were reviewed and updated as appropriate: allergies, current medications, past family history, past medical history, past social history, past surgical history and problem list. Problem list updated.  Objective:   Vitals:   03/08/24 1533  BP: 113/71  Pulse: 75  Weight: 209 lb 12.8 oz (95.2 kg)  Height: 5' 6 (1.676 m)   Physical Exam Vitals and nursing note reviewed. Exam conducted with a chaperone present Brett Orange).  Constitutional:      Appearance: Normal appearance.  HENT:     Head: Normocephalic and atraumatic.     Mouth/Throat:     Mouth: Mucous membranes are moist.     Pharynx: Oropharynx is clear. No oropharyngeal exudate or posterior oropharyngeal erythema.  Pulmonary:     Effort: Pulmonary effort is normal.  Abdominal:     General: Abdomen is flat.  Genitourinary:    General: Normal vulva.     Exam position: Lithotomy position.     Pubic Area: No rash or pubic lice.      Labia:  Right: No rash or lesion.        Left: No rash or lesion.      Vagina: Normal. No vaginal discharge, erythema, bleeding or lesions.     Cervix: No discharge, friability, lesion or erythema.      Comments: Red circle represents small, non-tender bump that likely represents a small area of folliculitis. Does not look like syphilis chancre or HSV. Has been present on/off for a year, with it partially resolving/draining then increasing in size.  pH <4.5 Lymphadenopathy:     Head:     Right  side of head: No preauricular or posterior auricular adenopathy.     Left side of head: No preauricular or posterior auricular adenopathy.     Cervical: No cervical adenopathy.     Upper Body:     Right upper body: No supraclavicular, axillary or epitrochlear adenopathy.     Left upper body: No supraclavicular, axillary or epitrochlear adenopathy.     Lower Body: No right inguinal adenopathy. No left inguinal adenopathy.  Skin:    General: Skin is warm and dry.     Findings: No rash.  Neurological:     Mental Status: She is alert and oriented to person, place, and time.    Assessment and Plan:  Patricia Medina is a 28 y.o. female G1P0010 presenting to the [facility] for an yearly wellness and contraception visit  Family planning  Contraception counseling:  Reviewed options based on patient desire and reproductive life plan. Has Nexplanon  in place, expires July 2026.    The patient will follow up in  1 years for surveillance.  The patient was told to call with any further questions, or with any concerns about this method of contraception.  Emphasized use of condoms 100% of the time for STI prevention.  Emergency Contraception Precautions (ECP): Patient assessed for need of ECP. She is not a candidate based on LARC in place and unexpired .   2. Screening for cervical cancer (Primary)  - IGP, rfx Aptima HPV ASCU  3. Screening for venereal disease  - Chlamydia/Gonorrhea [facility] - WET PREP FOR TRICH, YEAST, CLUE positive for clue & amine, but no symptoms/normal pH. Discussed with patient and no treatment today. Patient will return if symptoms occur.  Return in about 1 year (around 03/08/2025). Nexplanon  expires July 2026  Future Appointments  Date Time Provider Department Center  10/27/2024 10:20 AM Dineen Channel, PA-C [facility]    Damien FORBES Satchel, NP

## 2024-03-10 LAB — IGP, RFX APTIMA HPV ASCU: PAP Smear Comment: 0

## 2024-03-24 NOTE — Addendum Note (Signed)
 Addended by: ROSABEL DAMIEN BRAVO on: 03/24/2024 04:11 PM   Modules accepted: Level of Service

## 2024-03-25 NOTE — Addendum Note (Signed)
 Addended by: ROSABEL DAMIEN BRAVO on: 03/25/2024 02:03 PM   Modules accepted: Level of Service

## 2024-10-27 ENCOUNTER — Encounter: Admitting: Physician Assistant
